# Patient Record
Sex: Female | Born: 1940
Health system: Southern US, Community
[De-identification: ages and names within clinical notes are randomized; demographics above are authoritative.]

## PROBLEM LIST (undated history)

## (undated) HISTORY — PX: TUBAL LIGATION: SHX77

---

## 2014-12-13 DIAGNOSIS — Z0000 Encounter for general adult medical examination without abnormal findings: Secondary | ICD-10-CM | POA: Diagnosis not present

## 2014-12-15 DIAGNOSIS — Z0000 Encounter for general adult medical examination without abnormal findings: Secondary | ICD-10-CM | POA: Diagnosis not present

## 2014-12-27 ENCOUNTER — Ambulatory Visit
Admit: 2014-12-27 | Disposition: A | Discharge status: Home / Self Care | Payer: Self-pay | Attending: Family Medicine | Admitting: Family Medicine

## 2014-12-27 DIAGNOSIS — Z1231 Encounter for screening mammogram for malignant neoplasm of breast: Secondary | ICD-10-CM | POA: Diagnosis not present

## 2017-03-23 ENCOUNTER — Other Ambulatory Visit: Payer: Self-pay | Admitting: Family Medicine

## 2017-03-23 DIAGNOSIS — Z1231 Encounter for screening mammogram for malignant neoplasm of breast: Secondary | ICD-10-CM

## 2017-03-31 ENCOUNTER — Ambulatory Visit
Admission: RE | Admit: 2017-03-31 | Discharge: 2017-03-31 | Disposition: A | Discharge status: Home / Self Care | Payer: Medicare HMO | Source: Ambulatory Visit | Attending: Family Medicine | Admitting: Family Medicine

## 2017-03-31 DIAGNOSIS — Z1231 Encounter for screening mammogram for malignant neoplasm of breast: Secondary | ICD-10-CM

## 2017-04-06 DIAGNOSIS — E7800 Pure hypercholesterolemia, unspecified: Secondary | ICD-10-CM | POA: Diagnosis not present

## 2017-04-06 DIAGNOSIS — M791 Myalgia: Secondary | ICD-10-CM | POA: Diagnosis not present

## 2017-04-27 ENCOUNTER — Encounter: Payer: Self-pay | Admitting: Radiology

## 2017-04-27 ENCOUNTER — Ambulatory Visit
Admission: RE | Admit: 2017-04-27 | Discharge: 2017-04-27 | Disposition: A | Discharge status: Home / Self Care | Payer: Medicare HMO | Source: Ambulatory Visit | Attending: Family Medicine | Admitting: Family Medicine

## 2017-04-27 DIAGNOSIS — Z1231 Encounter for screening mammogram for malignant neoplasm of breast: Secondary | ICD-10-CM | POA: Insufficient documentation

## 2018-05-20 DIAGNOSIS — Z0000 Encounter for general adult medical examination without abnormal findings: Secondary | ICD-10-CM | POA: Diagnosis not present

## 2018-05-20 DIAGNOSIS — E7800 Pure hypercholesterolemia, unspecified: Secondary | ICD-10-CM | POA: Diagnosis not present

## 2018-05-27 DIAGNOSIS — E7800 Pure hypercholesterolemia, unspecified: Secondary | ICD-10-CM | POA: Diagnosis not present

## 2018-05-27 DIAGNOSIS — Z0000 Encounter for general adult medical examination without abnormal findings: Secondary | ICD-10-CM | POA: Diagnosis not present

## 2018-06-07 ENCOUNTER — Other Ambulatory Visit: Payer: Self-pay | Admitting: Family Medicine

## 2018-06-07 DIAGNOSIS — Z1231 Encounter for screening mammogram for malignant neoplasm of breast: Secondary | ICD-10-CM

## 2018-06-22 ENCOUNTER — Ambulatory Visit
Admission: RE | Admit: 2018-06-22 | Discharge: 2018-06-22 | Disposition: A | Discharge status: Home / Self Care | Payer: Medicare HMO | Source: Ambulatory Visit | Attending: Family Medicine | Admitting: Family Medicine

## 2018-06-22 DIAGNOSIS — Z1231 Encounter for screening mammogram for malignant neoplasm of breast: Secondary | ICD-10-CM | POA: Diagnosis not present

## 2018-09-20 DIAGNOSIS — E7800 Pure hypercholesterolemia, unspecified: Secondary | ICD-10-CM | POA: Diagnosis not present

## 2018-09-27 DIAGNOSIS — Z23 Encounter for immunization: Secondary | ICD-10-CM | POA: Diagnosis not present

## 2018-09-27 DIAGNOSIS — E7800 Pure hypercholesterolemia, unspecified: Secondary | ICD-10-CM | POA: Diagnosis not present

## 2019-09-05 ENCOUNTER — Other Ambulatory Visit: Payer: Self-pay | Admitting: Family Medicine

## 2019-09-05 DIAGNOSIS — Z1231 Encounter for screening mammogram for malignant neoplasm of breast: Secondary | ICD-10-CM

## 2019-09-28 ENCOUNTER — Ambulatory Visit
Admission: RE | Admit: 2019-09-28 | Discharge: 2019-09-28 | Disposition: A | Discharge status: Home / Self Care | Payer: Medicare Other | Source: Ambulatory Visit | Attending: Family Medicine | Admitting: Family Medicine

## 2019-09-28 DIAGNOSIS — Z1231 Encounter for screening mammogram for malignant neoplasm of breast: Secondary | ICD-10-CM | POA: Insufficient documentation

## 2020-01-13 ENCOUNTER — Observation Stay
Admission: EM | Admit: 2020-01-13 | Discharge: 2020-01-14 | Disposition: A | Discharge status: Home / Self Care | Payer: Medicare Other | Attending: Internal Medicine | Admitting: Internal Medicine

## 2020-01-13 ENCOUNTER — Emergency Department: Payer: Medicare Other

## 2020-01-13 ENCOUNTER — Other Ambulatory Visit: Payer: Self-pay

## 2020-01-13 ENCOUNTER — Encounter: Payer: Self-pay | Admitting: Emergency Medicine

## 2020-01-13 DIAGNOSIS — E785 Hyperlipidemia, unspecified: Secondary | ICD-10-CM | POA: Insufficient documentation

## 2020-01-13 DIAGNOSIS — Z79899 Other long term (current) drug therapy: Secondary | ICD-10-CM | POA: Diagnosis not present

## 2020-01-13 DIAGNOSIS — R42 Dizziness and giddiness: Secondary | ICD-10-CM

## 2020-01-13 DIAGNOSIS — E876 Hypokalemia: Secondary | ICD-10-CM

## 2020-01-13 DIAGNOSIS — I10 Essential (primary) hypertension: Secondary | ICD-10-CM | POA: Diagnosis not present

## 2020-01-13 DIAGNOSIS — Z8673 Personal history of transient ischemic attack (TIA), and cerebral infarction without residual deficits: Secondary | ICD-10-CM | POA: Insufficient documentation

## 2020-01-13 DIAGNOSIS — I639 Cerebral infarction, unspecified: Secondary | ICD-10-CM | POA: Diagnosis not present

## 2020-01-13 DIAGNOSIS — Z20822 Contact with and (suspected) exposure to covid-19: Secondary | ICD-10-CM | POA: Diagnosis not present

## 2020-01-13 DIAGNOSIS — R112 Nausea with vomiting, unspecified: Secondary | ICD-10-CM | POA: Insufficient documentation

## 2020-01-13 LAB — BASIC METABOLIC PANEL
Anion gap: 10 (ref 5–15)
BUN: 13 mg/dL (ref 8–23)
CO2: 28 mmol/L (ref 22–32)
Calcium: 9 mg/dL (ref 8.9–10.3)
Chloride: 102 mmol/L (ref 98–111)
Creatinine, Ser: 0.82 mg/dL (ref 0.44–1.00)
GFR calc Af Amer: >60 mL/min (ref >60)
GFR calc non Af Amer: >60 mL/min (ref >60)
Glucose, Bld: 142 mg/dL — ABNORMAL HIGH (ref 70–99)
Potassium: 3.3 mmol/L — ABNORMAL LOW (ref 3.5–5.1)
Sodium: 140 mmol/L (ref 135–145)

## 2020-01-13 LAB — URINALYSIS, COMPLETE (UACMP) WITH MICROSCOPIC
Bilirubin Urine: NEGATIVE
Glucose, UA: 50 mg/dL — AB
Hgb urine dipstick: NEGATIVE
Ketones, ur: NEGATIVE mg/dL
Leukocytes,Ua: NEGATIVE
Nitrite: NEGATIVE
Protein, ur: >=300 mg/dL — AB
Specific Gravity, Urine: 1.018 (ref 1.005–1.030)
pH: 7 (ref 5.0–8.0)

## 2020-01-13 LAB — TROPONIN I (HIGH SENSITIVITY)
Troponin I (High Sensitivity): 10 ng/L (ref <18)
Troponin I (High Sensitivity): 11 ng/L (ref <18)
Troponin I (High Sensitivity): 9 ng/L (ref <18)

## 2020-01-13 LAB — CBC
HCT: 42.8 % (ref 36.0–46.0)
Hemoglobin: 13.7 g/dL (ref 12.0–15.0)
MCH: 24.7 pg — ABNORMAL LOW (ref 26.0–34.0)
MCHC: 32 g/dL (ref 30.0–36.0)
MCV: 77.3 fL — ABNORMAL LOW (ref 80.0–100.0)
Platelets: 284 10*3/uL (ref 150–400)
RBC: 5.54 MIL/uL — ABNORMAL HIGH (ref 3.87–5.11)
RDW: 14.3 % (ref 11.5–15.5)
WBC: 11.4 10*3/uL — ABNORMAL HIGH (ref 4.0–10.5)
nRBC: 0 % (ref 0.0–0.2)

## 2020-01-13 MED ORDER — SODIUM CHLORIDE 0.9 % IV SOLN: INTRAVENOUS | Status: DC

## 2020-01-13 MED ORDER — ONDANSETRON HCL 4 MG/2ML IJ SOLN: 4.0000 mg | Freq: Four times a day (QID) | INTRAMUSCULAR | Status: DC | PRN

## 2020-01-13 MED ORDER — MAGNESIUM HYDROXIDE 400 MG/5ML PO SUSP
30.0000 mL | Freq: Every day | ORAL | Status: DC | PRN
  Filled 2020-01-13: qty 30

## 2020-01-13 MED ORDER — ENOXAPARIN SODIUM 40 MG/0.4ML ~~LOC~~ SOLN
40.0000 mg | SUBCUTANEOUS | Status: DC
  Administered 2020-01-13: 40 mg via SUBCUTANEOUS
  Filled 2020-01-13: qty 0.4

## 2020-01-13 MED ORDER — ONDANSETRON HCL 4 MG/2ML IJ SOLN
4.0000 mg | Freq: Once | INTRAMUSCULAR | Status: AC
  Administered 2020-01-13: 4 mg via INTRAVENOUS
  Filled 2020-01-13: qty 2

## 2020-01-13 MED ORDER — ATORVASTATIN CALCIUM 20 MG PO TABS
40.0000 mg | ORAL_TABLET | Freq: Every day | ORAL | Status: DC
  Administered 2020-01-13: 40 mg via ORAL
  Filled 2020-01-13: qty 2

## 2020-01-13 MED ORDER — TRAZODONE HCL 50 MG PO TABS: 25.0000 mg | ORAL_TABLET | Freq: Every evening | ORAL | Status: DC | PRN

## 2020-01-13 MED ORDER — ACETAMINOPHEN 325 MG PO TABS: 650.0000 mg | ORAL_TABLET | Freq: Four times a day (QID) | ORAL | Status: DC | PRN

## 2020-01-13 MED ORDER — ASPIRIN EC 81 MG PO TBEC
81.0000 mg | DELAYED_RELEASE_TABLET | Freq: Every day | ORAL | Status: DC
  Administered 2020-01-13 – 2020-01-14 (×2): 81 mg via ORAL
  Filled 2020-01-13 (×2): qty 1

## 2020-01-13 MED ORDER — ACETAMINOPHEN 650 MG RE SUPP: 650.0000 mg | Freq: Four times a day (QID) | RECTAL | Status: DC | PRN

## 2020-01-13 MED ORDER — LABETALOL HCL 5 MG/ML IV SOLN: 20.0000 mg | INTRAVENOUS | Status: DC | PRN

## 2020-01-13 MED ORDER — STROKE: EARLY STAGES OF RECOVERY BOOK: Freq: Once | Status: AC

## 2020-01-13 MED ORDER — ONDANSETRON HCL 4 MG PO TABS: 4.0000 mg | ORAL_TABLET | Freq: Four times a day (QID) | ORAL | Status: DC | PRN

## 2020-01-13 MED ORDER — SODIUM CHLORIDE 0.9% FLUSH: 3.0000 mL | Freq: Once | INTRAVENOUS | Status: DC

## 2020-01-13 NOTE — ED Notes (Signed)
Offered pt food tray and drink but pt declined. Denies any needs.

## 2020-01-13 NOTE — ED Notes (Signed)
Pt's daughter given phone to talk with MRI staff as she speaks english. Asked if pt would like translator but family declined.

## 2020-01-13 NOTE — ED Notes (Signed)
Pt currently denies nausea 

## 2020-01-13 NOTE — H&P (Signed)
[facility]   PATIENT NAME: Kelsey Pineda    MR#:  [id number]  DATE OF BIRTH:  20-Allyiah Gartner-1942  DATE OF ADMISSION:  01/13/2020  PRIMARY CARE PHYSICIAN: Marisue Ivan, MD   REQUESTING/REFERRING PHYSICIAN: Artis Delay, MD  CHIEF COMPLAINT:   Chief Complaint  Patient presents with  . Dizziness  . Emesis    HISTORY OF PRESENT ILLNESS:  Kelsey Pineda  is a 79 y.o. female with a known history of hypertension and dyslipidemia, presented to emergency room with acute onset of dizziness and vomiting that started around 9 AM today with lightheadedness.  She was unable to walk but her daughter who provides translation as the patient speaks only Chad.  Stated that she did not have tinnitus or vertigo.  No chest pain or palpitations.  No nausea or vomiting or abdominal pain.  No cough or wheezing.  No witnessed seizures.  No loss of bowel or bladder control.  when she came to the ER, blood pressure was 181/73 with otherwise normal vital signs.  Labs revealed mild hypokalemia with potassium 3.3 and mild cytosis, UA with more than 300 protein and only 6-10 WBCs.  Noncontrasted head CT scan showed mild diffuse cerebral and cerebellar atrophy and chronic minimal small vessel white matter ischemic changes in both cerebral hemispheres with no acute abnormalities.  An MRI of the brain without contrast revealed acute nonhemorrhagic infarct involving the medial and posterior left cerebellar hemisphere with atrophy and white matter disease that is moderately advanced for age and represents the sequela of chronic microvascular ischemia.  She had normal flow voids in the posterior circulation.  The patient was ordered for milligrams IV Zofran.  She will be admitted to a medically monitored observation bed for further evaluation and management.  PAST MEDICAL HISTORY:  History reviewed. No pertinent past medical history. -Hypertension -Dyslipidemia  PAST SURGICAL HISTORY:   Past  Surgical History:  Procedure Laterality Date  . TUBAL LIGATION      SOCIAL HISTORY:   Social History   Tobacco Use  . Smoking status: Never Smoker  . Smokeless tobacco: Never Used  Substance Use Topics  . Alcohol use: Not Currently    FAMILY HISTORY:   Family History  Problem Relation Age of Onset  . Breast cancer Daughter 66    DRUG ALLERGIES:  No Known Allergies  REVIEW OF SYSTEMS:   ROS As per history of present illness. All pertinent systems were reviewed above. Constitutional,  HEENT, cardiovascular, respiratory, GI, GU, musculoskeletal, neuro, psychiatric, endocrine,  integumentary and hematologic systems were reviewed and are otherwise  negative/unremarkable except for positive findings mentioned above in the HPI.   MEDICATIONS AT HOME:   Prior to Admission medications   Not on File      VITAL SIGNS:  Blood pressure (!) 173/99, pulse 78, temperature 97.9 F (36.6 C), temperature source Oral, resp. rate 19, weight 54.4 kg, SpO2 96 %.  PHYSICAL EXAMINATION:  Physical Exam  GENERAL:  79 y.o.-year-old patient lying in the bed with no acute distress.  EYES: Pupils equal, round, reactive to light and accommodation. No scleral icterus. Extraocular muscles intact.  HEENT: Head atraumatic, normocephalic. Oropharynx and nasopharynx clear.  NECK:  Supple, no jugular venous distention. No thyroid enlargement, no tenderness.  LUNGS: Normal breath sounds bilaterally, no wheezing, rales,rhonchi or crepitation. No use of accessory muscles of respiration.  CARDIOVASCULAR: Regular rate and rhythm, S1, S2 normal. No murmurs, rubs, or gallops.  ABDOMEN: Soft, nondistended, nontender. Bowel sounds present.  No organomegaly or mass.  EXTREMITIES: No pedal edema, cyanosis, or clubbing.  NEUROLOGIC: Cranial nerves II through XII are intact. Muscle strength 5/5 in all extremities. Sensation intact. Gait not checked.  PSYCHIATRIC: The patient is alert and oriented x 3.  Normal  affect and good eye contact. SKIN: No obvious rash, lesion, or ulcer.   LABORATORY PANEL:   CBC Recent Labs  Lab 01/13/20 1238  WBC 11.4*  HGB 13.7  HCT 42.8  PLT 284   ------------------------------------------------------------------------------------------------------------------  Chemistries  Recent Labs  Lab 01/13/20 1238  NA 140  K 3.3*  CL 102  CO2 28  GLUCOSE 142*  BUN 13  CREATININE 0.82  CALCIUM 9.0   ------------------------------------------------------------------------------------------------------------------  Cardiac Enzymes No results for input(s): TROPONINI in the last 168 hours. ------------------------------------------------------------------------------------------------------------------  RADIOLOGY:  CT Head Wo Contrast  Result Date: 01/13/2020 CLINICAL DATA:  Ataxia, dizziness and vomiting beginning 30 minutes prior to admission. EXAM: CT HEAD WITHOUT CONTRAST TECHNIQUE: Contiguous axial images were obtained from the base of the skull through the vertex without intravenous contrast. COMPARISON:  None. FINDINGS: Brain: Mildly enlarged ventricles and cortical sulci. Minimal patchy white matter low density in both cerebral hemispheres. No intracranial hemorrhage, mass lesion or CT evidence of acute infarction. Vascular: No hyperdense vessel or unexpected calcification. Skull: Normal. Negative for fracture or focal lesion. Sinuses/Orbits: Unremarkable. Other: None. IMPRESSION: 1. No acute abnormality. 2. Mild diffuse cerebral and cerebellar atrophy. 3. Minimal chronic small vessel white matter ischemic changes in both cerebral hemispheres. Electronically Signed   By: Beckie Salts M.D.   On: 01/13/2020 15:51   MR BRAIN WO CONTRAST  Result Date: 01/13/2020 CLINICAL DATA:  Acute onset of dizziness and emesis. EXAM: MRI HEAD WITHOUT CONTRAST TECHNIQUE: Multiplanar, multiecho pulse sequences of the brain and surrounding structures were obtained without  intravenous contrast. COMPARISON:  CT head without contrast 01/13/2020 FINDINGS: Brain: The diffusion-weighted images demonstrate acute nonhemorrhagic infarct of the medial and posterior left cerebellar hemisphere. T2 signal changes are associated. No other acute infarct is present. Periventricular and diffuse subcortical white matter changes are moderately advanced. Mild generalized atrophy is present. The ventricles are of normal size. No significant extraaxial fluid collection is present. Vascular: Flow is present in the major intracranial arteries. Skull and upper cervical spine: The craniocervical junction is normal. Upper cervical spine is within normal limits. Marrow signal is unremarkable. Sinuses/Orbits: The paranasal sinuses and mastoid air cells are clear. The globes and orbits are within normal limits. IMPRESSION: 1. Acute nonhemorrhagic infarct involving the medial and posterior left cerebellar hemisphere. 2. Normal flow voids in the posterior circulation. 3. Atrophy and white matter disease is moderately advanced for age. This likely reflects the sequela of chronic microvascular ischemia. These results were called by telephone at the time of interpretation on 01/13/2020 at 6:33 pm to provider DAVID YAO , who verbally acknowledged these results. Electronically Signed   By: Marin Roberts M.D.   On: 01/13/2020 18:34      IMPRESSION AND PLAN:   1.  Acute left cerebellar infarct. -The patient will be admitted to a medically monitored bed. -We will place her on aspirin and statin therapy. -We will check her fasting lipids. -Permissive blood pressure control will be allowed. -PT/OT, ST and neurology consults will be obtained. -We will obtain bilateral carotid Doppler and 2D echo with bubble study for further assessment.  2.  Hypokalemia. -Potassium will be replaced and magnesium level will be checked.  3.  Hypertension. -The patient will be placed on  as needed IV labetalol for  now. -Permissive hypertension will be allowed.  4.  Dyslipidemia. -The patient will be placed on statin therapy I will check fasting lipids.  5.  DVT prophylaxis. -Subtenons Lovenox.   All the records are reviewed and case discussed with ED provider. The plan of care was discussed in details with the patient (and family). I answered all questions. The patient agreed to proceed with the above mentioned plan. Further management will depend upon hospital course.   CODE STATUS: Full code  Status is: Observation  The patient remains OBS appropriate and will d/c before 2 midnights.  Dispo: The patient is from: Home              Anticipated d/c is to: Home              Anticipated d/c date is: 1 day              Patient currently is not medically stable to d/c.   TOTAL TIME TAKING CARE OF THIS PATIENT: 55 minutes.    Christel Mormon M.D on 01/13/2020 at 7:24 PM  [facility]   From 7 PM-7 AM, contact night-coverage [email or web address]  CC: Primary care physician; Dion Body, MD   Note: This dictation was prepared with Dragon dictation along with smaller phrase technology. Any transcriptional errors that result from this process are unintentional.

## 2020-01-13 NOTE — ED Notes (Signed)
Admitting/consulting provider at bedside.

## 2020-01-13 NOTE — ED Notes (Signed)
Pt leaving for imaging.

## 2020-01-13 NOTE — ED Provider Notes (Signed)
[facility] Emergency Department Provider Note  ____________________________________________   First MD Initiated Contact with Patient 01/13/20 1534     (approximate)  I have reviewed the triage vital signs and the nursing notes.   HISTORY  Chief Complaint Dizziness and Emesis    HPI Kelsey Pineda is a 79 y.o. female with hypertension, hyperlipidemia who comes in for sudden onset dizziness.  Around 1030 patient started feeling dizzy like she was going to pass out as well as had nonbloody nonbilious vomiting.  Her symptoms have been moderate, constant, occur at rest but worse with moving around.  Denies ever having anything like this before.  Denies any abdominal pain, fevers.  Does not sound like any chest pain or shortness of breath or abdominal pain.   Family declined interpreter and daughter was at bedside who preferred to be the interpreter.          History reviewed. No pertinent past medical history.  There are no problems to display for this patient.   Past Surgical History:  Procedure Laterality Date  . TUBAL LIGATION      Prior to Admission medications   Not on File    Allergies Patient has no known allergies.  Family History  Problem Relation Age of Onset  . Breast cancer Daughter 45    Social History Social History   Tobacco Use  . Smoking status: Never Smoker  . Smokeless tobacco: Never Used  Substance Use Topics  . Alcohol use: Not Currently  . Drug use: Not Currently      Review of Systems Constitutional: No fever/chills Eyes: No visual changes. ENT: No sore throat. Cardiovascular: Denies chest pain. Respiratory: Denies shortness of breath. Gastrointestinal: No abdominal pain.  Positive nausea and vomiting no diarrhea.  No constipation. Genitourinary: Negative for dysuria. Musculoskeletal: Negative for back pain. Skin: Negative for rash. Neurological: Negative for headaches, focal weakness or numbness.   Positive lightheadedness and dizziness All other ROS negative ____________________________________________   PHYSICAL EXAM:  VITAL SIGNS: ED Triage Vitals  Enc Vitals Group     BP 01/13/20 1230 (!) 202/78     Pulse Rate 01/13/20 1230 90     Resp 01/13/20 1230 16     Temp 01/13/20 1230 97.8 F (36.6 C)     Temp Source 01/13/20 1230 Oral     SpO2 01/13/20 1230 99 %     Weight 01/13/20 1232 120 lb (54.4 kg)     Height --      Head Circumference --      Peak Flow --      Pain Score 01/13/20 1232 0     Pain Loc --      Pain Edu? --      Excl. in [place]? --     Constitutional: Alert and oriented. Well appearing and in no acute distress. Eyes: Conjunctivae are normal. EOMI. no nystagmus Head: Atraumatic. Nose: No congestion/rhinnorhea. Mouth/Throat: Mucous membranes are moist.   Neck: No stridor. Trachea Midline. FROM Cardiovascular: Normal rate, regular rhythm. Grossly normal heart sounds.  Good peripheral circulation. Respiratory: Normal respiratory effort.  No retractions. Lungs CTAB. Gastrointestinal: Soft and nontender. No distention. No abdominal bruits.  Musculoskeletal: No lower extremity tenderness nor edema.  No joint effusions. Neurologic: Nerves II through XII are intact.  Positive Romberg sign, finger-to-nose seems to be pretty intact maybe a little bit less coordinated on the left hand Skin:  Skin is warm, dry and intact. No rash noted. Psychiatric: Mood and affect  are normal. Speech and behavior are normal. GU: Deferred   ____________________________________________   LABS (all labs ordered are listed, but only abnormal results are displayed)  Labs Reviewed  BASIC METABOLIC PANEL - Abnormal; Notable for the following components:      Result Value   Potassium 3.3 (*)    Glucose, Bld 142 (*)    All other components within normal limits  CBC - Abnormal; Notable for the following components:   WBC 11.4 (*)    RBC 5.54 (*)    MCV 77.3 (*)    MCH 24.7 (*)     All other components within normal limits  URINALYSIS, COMPLETE (UACMP) WITH MICROSCOPIC - Abnormal; Notable for the following components:   Color, Urine YELLOW (*)    APPearance CLOUDY (*)    Glucose, UA 50 (*)    Protein, ur >=300 (*)    Bacteria, UA MANY (*)    All other components within normal limits  URINE CULTURE  CBG MONITORING, ED  TROPONIN I (HIGH SENSITIVITY)   ____________________________________________   ED ECG REPORT I, Concha Se, the attending physician, personally viewed and interpreted this ECG.  EKG is normal sinus rate of 89, no ST elevation, T wave inversions in 2 3 aVF V5 and V6, no priors to compare to, normal intervals ____________________________________________  RADIOLOGY I, Concha Se, personally viewed and evaluated these images (plain radiographs) as part of my medical decision making, as well as reviewing the written report by the radiologist.  ED MD interpretation: No intracranial hemorrhage  Official radiology report(s): CT Head Wo Contrast  Result Date: 01/13/2020 CLINICAL DATA:  Ataxia, dizziness and vomiting beginning 30 minutes prior to admission. EXAM: CT HEAD WITHOUT CONTRAST TECHNIQUE: Contiguous axial images were obtained from the base of the skull through the vertex without intravenous contrast. COMPARISON:  None. FINDINGS: Brain: Mildly enlarged ventricles and cortical sulci. Minimal patchy white matter low density in both cerebral hemispheres. No intracranial hemorrhage, mass lesion or CT evidence of acute infarction. Vascular: No hyperdense vessel or unexpected calcification. Skull: Normal. Negative for fracture or focal lesion. Sinuses/Orbits: Unremarkable. Other: None. IMPRESSION: 1. No acute abnormality. 2. Mild diffuse cerebral and cerebellar atrophy. 3. Minimal chronic small vessel white matter ischemic changes in both cerebral hemispheres. Electronically Signed   By: Beckie Salts M.D.   On: 01/13/2020 15:51     ____________________________________________   PROCEDURES  Procedure(s) performed (including Critical Care):  Procedures   ____________________________________________   INITIAL IMPRESSION / ASSESSMENT AND PLAN / ED COURSE  Kelsey Pineda was evaluated in Emergency Department on 01/13/2020 for the symptoms described in the history of present illness. She was evaluated in the context of the global COVID-19 pandemic, which necessitated consideration that the patient might be at risk for infection with the SARS-CoV-2 virus that causes COVID-19. Institutional protocols and algorithms that pertain to the evaluation of patients at risk for COVID-19 are in a state of rapid change based on information released by regulatory bodies including the CDC and federal and state organizations. These policies and algorithms were followed during the patient's care in the ED.    Patient is a 79 year old who comes in hypertensive with sudden onset vomiting and headaches.  Will get labs to evaluate for Electra abnormalities, AKI, anemia, UTI.  Will get CT head to evaluate for intracranial hemorrhage.  If work-up is otherwise reassuring we will get an MRI to make sure no evidence of cerebellar stroke or press syndrome.  Patient EKG had some flipped  T waves so we will add on troponin.  Troponin was reassuring.  Labs reassuring.  UA with some protein and concern for WBCs but denies any urinary symptoms.  Will send culture.  MRI consistent with stroke in the cerebellum.  Will discuss with hospital team for admission       ____________________________________________   FINAL CLINICAL IMPRESSION(S) / ED DIAGNOSES   Final diagnoses:  History of stroke involving cerebellum  Dizziness  Nausea and vomiting, intractability of vomiting not specified, unspecified vomiting type      MEDICATIONS GIVEN DURING THIS VISIT:  Medications  sodium chloride flush (NS) 0.9 % injection 3 mL (has no  administration in time range)  ondansetron (ZOFRAN) injection 4 mg (4 mg Intravenous Given 01/13/20 1847)     ED Discharge Orders    None       Note:  This document was prepared using Dragon voice recognition software and may include unintentional dictation errors.   Concha Se, MD 01/13/20 [phone]

## 2020-01-13 NOTE — ED Triage Notes (Signed)
Pt to ED via POV c/o dizziness and emesis that started about 30 minutes PTA. Pt denies sensation of room spinning. Pt is in NAD.

## 2020-01-13 NOTE — ED Notes (Signed)
EDP [place] at bedside. Pt's visitor at bedside.

## 2020-01-13 NOTE — ED Notes (Signed)
Pt given cup for urine sample, unable to go at this time

## 2020-01-13 NOTE — ED Notes (Signed)
Pt back from imaging

## 2020-01-14 ENCOUNTER — Observation Stay: Payer: Medicare Other

## 2020-01-14 DIAGNOSIS — Z8673 Personal history of transient ischemic attack (TIA), and cerebral infarction without residual deficits: Secondary | ICD-10-CM

## 2020-01-14 DIAGNOSIS — R112 Nausea with vomiting, unspecified: Secondary | ICD-10-CM

## 2020-01-14 DIAGNOSIS — I639 Cerebral infarction, unspecified: Secondary | ICD-10-CM | POA: Diagnosis not present

## 2020-01-14 DIAGNOSIS — R42 Dizziness and giddiness: Secondary | ICD-10-CM

## 2020-01-14 LAB — LIPID PANEL
Cholesterol: 146 mg/dL (ref 0–200)
HDL: 54 mg/dL (ref >40)
LDL Cholesterol: 71 mg/dL (ref 0–99)
Total CHOL/HDL Ratio: 2.7 RATIO
Triglycerides: 105 mg/dL (ref <150)
VLDL: 21 mg/dL (ref 0–40)

## 2020-01-14 LAB — HEMOGLOBIN A1C
Hgb A1c MFr Bld: 5.7 % — ABNORMAL HIGH (ref 4.8–5.6)
Mean Plasma Glucose: 116.89 mg/dL

## 2020-01-14 LAB — CBC
HCT: 38.6 % (ref 36.0–46.0)
Hemoglobin: 12.8 g/dL (ref 12.0–15.0)
MCH: 25.5 pg — ABNORMAL LOW (ref 26.0–34.0)
MCHC: 33.2 g/dL (ref 30.0–36.0)
MCV: 76.9 fL — ABNORMAL LOW (ref 80.0–100.0)
Platelets: 277 10*3/uL (ref 150–400)
RBC: 5.02 MIL/uL (ref 3.87–5.11)
RDW: 14.4 % (ref 11.5–15.5)
WBC: 8.3 10*3/uL (ref 4.0–10.5)
nRBC: 0 % (ref 0.0–0.2)

## 2020-01-14 LAB — BASIC METABOLIC PANEL
Anion gap: 10 (ref 5–15)
BUN: 17 mg/dL (ref 8–23)
CO2: 26 mmol/L (ref 22–32)
Calcium: 8.6 mg/dL — ABNORMAL LOW (ref 8.9–10.3)
Chloride: 106 mmol/L (ref 98–111)
Creatinine, Ser: 1.03 mg/dL — ABNORMAL HIGH (ref 0.44–1.00)
GFR calc Af Amer: >60 mL/min (ref >60)
GFR calc non Af Amer: 52 mL/min — ABNORMAL LOW (ref >60)
Glucose, Bld: 109 mg/dL — ABNORMAL HIGH (ref 70–99)
Potassium: 3.1 mmol/L — ABNORMAL LOW (ref 3.5–5.1)
Sodium: 142 mmol/L (ref 135–145)

## 2020-01-14 LAB — URINE CULTURE: Culture: <10000 — AB

## 2020-01-14 LAB — SARS CORONAVIRUS 2 (TAT 6-24 HRS): SARS Coronavirus 2: NEGATIVE

## 2020-01-14 MED ORDER — POTASSIUM CHLORIDE CRYS ER 20 MEQ PO TBCR
40.0000 meq | EXTENDED_RELEASE_TABLET | Freq: Once | ORAL | Status: AC
  Administered 2020-01-14: 40 meq via ORAL
  Filled 2020-01-14: qty 2

## 2020-01-14 MED ORDER — ASPIRIN 81 MG PO TBEC: 81.0000 mg | DELAYED_RELEASE_TABLET | Freq: Every day | ORAL | 0 refills | Status: AC

## 2020-01-14 NOTE — Progress Notes (Signed)
Chart reviewed, Pt currenlty sleeping with no family in the room. Per Nsg notes, no dysphagia on swallow screen, regular diet ordered. No reports of speech or language difficulty in chart. Pt speaks Chad. No apparent ST needs noted noted the chart. Will f/u when family is present and Pt is awake to screen for any ST speech needs.

## 2020-01-14 NOTE — Progress Notes (Signed)
Son of pt, Orvilla Fus assisted in answering question for completion of admission profile.

## 2020-01-14 NOTE — Progress Notes (Signed)
Daughter is now in the room. She reports no speech, language or swallowing problems. Daughter stated Kelsey Pineda's speech is the same as it was prior to this event. No ST needs at this time. Please reconsult for swallow eval if any concerns are noted later.

## 2020-01-14 NOTE — Discharge Summary (Signed)
5         at [facility]   PATIENT NAME: Kelsey Pineda    MR#:  [id number]  DATE OF BIRTH:  1940-10-03  DATE OF ADMISSION:  01/13/2020   ADMITTING PHYSICIAN: Hannah Beat, MD  DATE OF DISCHARGE: 01/14/2020  1:42 PM  PRIMARY CARE PHYSICIAN: Marisue Ivan, MD   ADMISSION DIAGNOSIS:  Dizziness [R42] CVA (cerebral vascular accident) (HCC) [I63.9] Cerebellar stroke, acute (HCC) [I63.9] History of stroke involving cerebellum [Z86.73] Nausea and vomiting, intractability of vomiting not specified, unspecified vomiting type [R11.2] DISCHARGE DIAGNOSIS:  Active Problems:   Cerebrovascular accident (CVA) (HCC)   History of stroke involving cerebellum   Dizziness   Nausea and vomiting  SECONDARY DIAGNOSIS:  History reviewed. No pertinent past medical history. HOSPITAL COURSE:  79 y.o. female known history of hypertension and dyslipidemia admitted acute onset of dizziness and vomiting.   Acute stroke -Noncontrasted head CT scan showed mild diffuse cerebral and cerebellar atrophy and chronic minimal small vessel white matter ischemic changes in both cerebral hemispheres with no acute abnormalities.  - MRI of the brain without contrast revealed acute nonhemorrhagic infarct involving the medial and posterior left cerebellar hemisphere. No anti platelet therapy prior to admission.  Patient was started on ASA 81mg .  - As per daughter she is back to baseline - No reported dizziness -No skilled PT/OT identified - ASA 81mg  /statin daily  DISCHARGE CONDITIONS:  stable CONSULTS OBTAINED:  Treatment Team:  , MD DRUG ALLERGIES:  No Known Allergies DISCHARGE MEDICATIONS:   Allergies as of 01/14/2020   No Known Allergies     Medication List    TAKE these medications   aspirin 81 MG EC tablet Take 1 tablet (81 mg total) by mouth daily. Start taking on: January 15, 2020   atorvastatin 40 MG tablet Commonly known as: LIPITOR Take 40 mg by mouth  at bedtime.      DISCHARGE INSTRUCTIONS:   DIET:  Low fat, Low cholesterol diet DISCHARGE CONDITION:  Stable ACTIVITY:  Activity as tolerated OXYGEN:  Home Oxygen: No.  Oxygen Delivery: room air DISCHARGE LOCATION:  home   If you experience worsening of your admission symptoms, develop shortness of breath, life threatening emergency, suicidal or homicidal thoughts you must seek medical attention immediately by calling 911 or calling your MD immediately  if symptoms less severe.  You Must read complete instructions/literature along with all the possible adverse reactions/side effects for all the Medicines you take and that have been prescribed to you. Take any new Medicines after you have completely understood and accpet all the possible adverse reactions/side effects.   Please note  You were cared for by a hospitalist during your hospital stay. If you have any questions about your discharge medications or the care you received while you were in the hospital after you are discharged, you can call the unit and asked to speak with the hospitalist on call if the hospitalist that took care of you is not available. Once you are discharged, your primary care physician will handle any further medical issues. Please note that NO REFILLS for any discharge medications will be authorized once you are discharged, as it is imperative that you return to your primary care physician (or establish a relationship with a primary care physician if you do not have one) for your aftercare needs so that they can reassess your need for medications and monitor your lab values.    On the day of Discharge:  VITAL SIGNS:  Blood pressure (!) 156/72, pulse 76, temperature 98.2 F (36.8 C), temperature source Oral, resp. rate (!) 22, weight 54.4 kg, SpO2 93 %. PHYSICAL EXAMINATION:  GENERAL:  79 y.o.-year-old patient lying in the bed with no acute distress.  EYES: Pupils equal, round, reactive to light and  accommodation. No scleral icterus. Extraocular muscles intact.  HEENT: Head atraumatic, normocephalic. Oropharynx and nasopharynx clear.  NECK:  Supple, no jugular venous distention. No thyroid enlargement, no tenderness.  LUNGS: Normal breath sounds bilaterally, no wheezing, rales,rhonchi or crepitation. No use of accessory muscles of respiration.  CARDIOVASCULAR: S1, S2 normal. No murmurs, rubs, or gallops.  ABDOMEN: Soft, non-tender, non-distended. Bowel sounds present. No organomegaly or mass.  EXTREMITIES: No pedal edema, cyanosis, or clubbing.  NEUROLOGIC: Cranial nerves II through XII are intact. Muscle strength 5/5 in all extremities. Sensation intact. Gait not checked.  PSYCHIATRIC: The patient is alert and oriented x 3.  SKIN: No obvious rash, lesion, or ulcer.  DATA REVIEW:   CBC Recent Labs  Lab 01/14/20 0520  WBC 8.3  HGB 12.8  HCT 38.6  PLT 277    Chemistries  Recent Labs  Lab 01/14/20 0520  NA 142  K 3.1*  CL 106  CO2 26  GLUCOSE 109*  BUN 17  CREATININE 1.03*  CALCIUM 8.6*     Outpatient follow-up Follow-up Information    Dion Body, MD. Schedule an appointment as soon as possible for a visit in 1 week(s).   Specialty: Family Medicine Contact information: [address] [place] [address] [phone]        Vladimir Crofts, MD. Schedule an appointment as soon as possible for a visit in 2 week(s).   Specialty: Neurology Contact information: [address] [place] [place] [address] [phone]            Management plans discussed with the patient, family and they are in agreement.  CODE STATUS: Full Code   TOTAL TIME TAKING CARE OF THIS PATIENT: 45 minutes.    Max Sane M.D on 01/14/2020 at 4:38 PM  [facility]   CC: Primary care physician; Dion Body, MD   Note: This dictation was prepared with Dragon dictation along with smaller phrase  technology. Any transcriptional errors that result from this process are unintentional.

## 2020-01-14 NOTE — Progress Notes (Signed)
Pt discharged via private vehicle. Discharge instructions explained and given to pt's daughter. No further questions at this time. No s/s of distress noted. IV and tele box removed.

## 2020-01-14 NOTE — Progress Notes (Addendum)
Pt passed swallow screen

## 2020-01-14 NOTE — Discharge Instructions (Signed)
 Hospital Discharge After a Stroke  Being discharged from the hospital after a stroke can feel overwhelming. Many things may be different, and it is normal to feel scared or anxious. Some stroke survivors may be able to return to their homes, and others may need more specialized care on a temporary or permanent basis. Your stroke care team will work with you to develop a discharge plan that is best for you. Ask questions if you do not understand something. Invite a friend or family member to participate in discharge planning. Understanding and following your discharge plan can help to prevent another stroke or other problems. Understanding your medicines After a stroke, your health care provider may prescribe one or more types of medicine. It is important to take medicines exactly as told by your health care provider. Serious harm, such as another stroke, can happen if you are unable to take your medicine exactly as prescribed. Make sure you understand:  What medicine to take.  Why you are taking the medicine.  How and when to take it.  If it can be taken with your other medicines and herbal supplements.  Possible side effects.  When to call your health care provider if you have any side effects.  How you will get and pay for your medicines. Medical assistance programs may be able to help you pay for prescription medicines if you cannot afford them. If you are taking an anticoagulant, be sure to take it exactly as told by your health care provider. This type of medicine can increase the risk of bleeding because it works to prevent blood from clotting. You may need to take certain precautions to prevent bleeding. You should contact your health care provider if you have:  Bleeding or bruising.  A fall or other injury to your head.  Blood in your urine or stool (feces). Planning for home safety  Take steps to prevent falls, such as installing grab bars or using a shower chair. Ask a  friend or family member to get needed things in place before you go home if possible. A therapist can come to your home to make recommendations for safety equipment. Ask your health care provider if you would benefit from this service or from home care. Getting needed equipment Ask your health care provider for a list of any medical equipment and supplies you will need at home. These may include items such as:  Walkers.  Canes.  Wheelchairs.  Hand-strengthening devices.  Special eating utensils. Medical equipment can be rented or purchased, depending on your insurance coverage. Check with your insurance company about what is covered. Keeping follow-up visits After a stroke, you will need to follow up regularly with a health care provider. You may also need rehabilitation, which can include physical therapy, occupational therapy, or speech-language therapy. Keeping these appointments is very important to your recovery after a stroke. Be sure to bring your medicine list and discharge papers with you to your appointments. If you need help to keep track of your schedule, use a calendar or appointment reminder. Preventing another stroke Having a stroke puts you at risk for another stroke in the future. Ask your health care provider what actions you can take to lower the risk. These may include:  Increasing how much you exercise.  Making a healthy eating plan.  Quitting smoking.  Managing other health conditions, such as high blood pressure, high cholesterol, or diabetes.  Limiting alcohol use. Knowing the warning signs of a stroke  Make   sure you understand the signs of a stroke. Before you leave the hospital, you will receive information outlining the stroke warning signs. Share these with your friends and family members. "BE FAST" is an easy way to remember the main warning signs of a stroke:  B - Balance. Signs are dizziness, sudden trouble walking, or loss of balance.  E - Eyes.  Signs are trouble seeing or a sudden change in vision.  F - Face. Signs are sudden weakness or numbness of the face, or the face or eyelid drooping on one side.  A - Arms. Signs are weakness or numbness in an arm. This happens suddenly and usually on one side of the body.  S - Speech. Signs are sudden trouble speaking, slurred speech, or trouble understanding what people say.  T - Time. Time to call emergency services. Write down what time symptoms started. Other signs of stroke may include:  A sudden, severe headache with no known cause.  Nausea or vomiting.  Seizure. These symptoms may represent a serious problem that is an emergency. Do not wait to see if the symptoms will go away. Get medical help right away. Call your local emergency services (911 in the [place] Do not drive yourself to the hospital. Make note of the time that you had your first symptoms. Your emergency responders or emergency room staff will need to know this information. Summary  Being discharged from the hospital after a stroke can feel overwhelming. It is normal to feel scared or anxious.  Make sure you take medicines exactly as told by your health care provider.  Know the warning signs of a stroke, and get help right way if you have any of these symptoms. "BE FAST" is an easy way to remember the main warning signs of a stroke. This information is not intended to replace advice given to you by your health care provider. Make sure you discuss any questions you have with your health care provider. Document Revised: 06/08/2019 Document Reviewed: 12/19/2016 Elsevier Patient Education  2020 Elsevier Inc.  

## 2020-01-14 NOTE — Consult Note (Signed)
Reason for Consult: dizziness  Requesting Physician: Dr. Manuella Ghazi  CC: dizziness    HPI: Kelsey Pineda is an 79 y.o. female  known history of hypertension and dyslipidemia, presented to emergency room with acute onset of dizziness and vomiting that started around 9 AM yesterday ightheadedness.  She was unable to walk but her daughter who provides translation as the patient speaks only [place]. Bo nausea or vomiting or abdominal pain.  No cough or wheezing.  No witnessed seizures.  No loss of bowel or bladder control. Hypertensive on admission with BP of 181/73 Noncontrasted head CT scan showed mild diffuse cerebral and cerebellar atrophy and chronic minimal small vessel white matter ischemic changes in both cerebral hemispheres with no acute abnormalities.   On MRI of the brain without contrast revealed acute nonhemorrhagic infarct involving the medial and posterior left cerebellar hemisphere. As per daughter dizziness has resolved  History reviewed. No pertinent past medical history.  Past Surgical History:  Procedure Laterality Date  . TUBAL LIGATION      Family History  Problem Relation Age of Onset  . Breast cancer Daughter 27    Social History:  reports that she has never smoked. She has never used smokeless tobacco. She reports previous alcohol use. She reports previous drug use.  No Known Allergies  Medications: I have reviewed the patient's current medications.  ROS: Unable to obtain due to language barrier   Physical Examination: Blood pressure (!) 156/72, pulse 76, temperature 98.2 F (36.8 C), temperature source Oral, resp. rate (!) 22, weight 54.4 kg, SpO2 93 %.    Neurological Examination   Mental Status: Alert, oriented through translator Cranial Nerves: II: Discs flat bilaterally; Visual fields grossly normal, pupils equal, round, reactive to light and accommodation III,IV, VI: ptosis not present, extra-ocular motions intact bilaterally V,VII: smile symmetric,  facial light touch sensation normal bilaterally VIII: hearing normal bilaterally IX,X: gag reflex present XI: bilateral shoulder shrug XII: midline tongue extension Motor: Right : Upper extremity   5/5    Left:     Upper extremity   5/5  Lower extremity   5/5     Lower extremity   5/5 Tone and bulk:normal tone throughout; no atrophy noted Sensory: Pinprick and light touch intact throughout, bilaterally Deep Tendon Reflexes: 2+ and symmetric throughout Plantars: Right: downgoing   Left: downgoing Cerebellar: normal finger-to-nose, normal rapid alternating movements and normal heel-to-shin test Gait: not tested      Laboratory Studies:   Basic Metabolic Panel: Recent Labs  Lab 01/13/20 1238 01/14/20 0520  NA 140 142  K 3.3* 3.1*  CL 102 106  CO2 28 26  GLUCOSE 142* 109*  BUN 13 17  CREATININE 0.82 1.03*  CALCIUM 9.0 8.6*    Liver Function Tests: No results for input(s): AST, ALT, ALKPHOS, BILITOT, PROT, ALBUMIN in the last 168 hours. No results for input(s): LIPASE, AMYLASE in the last 168 hours. No results for input(s): AMMONIA in the last 168 hours.  CBC: Recent Labs  Lab 01/13/20 1238 01/14/20 0520  WBC 11.4* 8.3  HGB 13.7 12.8  HCT 42.8 38.6  MCV 77.3* 76.9*  PLT 284 277    Cardiac Enzymes: No results for input(s): CKTOTAL, CKMB, CKMBINDEX, TROPONINI in the last 168 hours.  BNP: Invalid input(s): POCBNP  CBG: No results for input(s): GLUCAP in the last 168 hours.  Microbiology: Results for orders placed or performed during the hospital encounter of 01/13/20  SARS CORONAVIRUS 2 (TAT 6-24 HRS) Nasopharyngeal Nasopharyngeal Swab  Status: None   Collection Time: 01/13/20  8:33 PM   Specimen: Nasopharyngeal Swab  Result Value Ref Range Status   SARS Coronavirus 2 NEGATIVE NEGATIVE Final    Comment: (NOTE) SARS-CoV-2 target nucleic acids are NOT DETECTED. The SARS-CoV-2 RNA is generally detectable in upper and lower respiratory specimens during  the acute phase of infection. Negative results do not preclude SARS-CoV-2 infection, do not rule out co-infections with other pathogens, and should not be used as the sole basis for treatment or other patient management decisions. Negative results must be combined with clinical observations, patient history, and epidemiological information. The expected result is Negative. Fact Sheet for Patients: HairSlick.no Fact Sheet for Healthcare Providers: quierodirigir.com This test is not yet approved or cleared by the [place] FDA and  has been authorized for detection and/or diagnosis of SARS-CoV-2 by FDA under an Emergency Use Authorization (EUA). This EUA will remain  in effect (meaning this test can be used) for the duration of the COVID-19 declaration under Section 56 4(b)(1) of the Act, 21 U.S.C. section 360bbb-3(b)(1), unless the authorization is terminated or revoked sooner. Performed at [facility], [address], [place], [place] [address]     Coagulation Studies: No results for input(s): LABPROT, INR in the last 72 hours.  Urinalysis:  Recent Labs  Lab 01/13/20 1238  COLORURINE YELLOW*  LABSPEC 1.018  PHURINE 7.0  GLUCOSEU 50*  HGBUR NEGATIVE  BILIRUBINUR NEGATIVE  KETONESUR NEGATIVE  PROTEINUR >=300*  NITRITE NEGATIVE  LEUKOCYTESUR NEGATIVE    Lipid Panel:     Component Value Date/Time   CHOL 146 01/14/2020 0520   TRIG 105 01/14/2020 0520   HDL 54 01/14/2020 0520   CHOLHDL 2.7 01/14/2020 0520   VLDL 21 01/14/2020 0520   LDLCALC 71 01/14/2020 0520    HgbA1C: No results found for: HGBA1C  Urine Drug Screen:  No results found for: LABOPIA, COCAINSCRNUR, LABBENZ, AMPHETMU, THCU, LABBARB  Alcohol Level: No results for input(s): ETH in the last 168 hours.  Other results: EKG: normal EKG, normal sinus rhythm, unchanged from previous tracings.  Imaging: CT Head Wo Contrast  Result Date:  01/13/2020 CLINICAL DATA:  Ataxia, dizziness and vomiting beginning 30 minutes prior to admission. EXAM: CT HEAD WITHOUT CONTRAST TECHNIQUE: Contiguous axial images were obtained from the base of the skull through the vertex without intravenous contrast. COMPARISON:  None. FINDINGS: Brain: Mildly enlarged ventricles and cortical sulci. Minimal patchy white matter low density in both cerebral hemispheres. No intracranial hemorrhage, mass lesion or CT evidence of acute infarction. Vascular: No hyperdense vessel or unexpected calcification. Skull: Normal. Negative for fracture or focal lesion. Sinuses/Orbits: Unremarkable. Other: None. IMPRESSION: 1. No acute abnormality. 2. Mild diffuse cerebral and cerebellar atrophy. 3. Minimal chronic small vessel white matter ischemic changes in both cerebral hemispheres. Electronically Signed   By: Beckie Salts M.D.   On: 01/13/2020 15:51   MR BRAIN WO CONTRAST  Result Date: 01/13/2020 CLINICAL DATA:  Acute onset of dizziness and emesis. EXAM: MRI HEAD WITHOUT CONTRAST TECHNIQUE: Multiplanar, multiecho pulse sequences of the brain and surrounding structures were obtained without intravenous contrast. COMPARISON:  CT head without contrast 01/13/2020 FINDINGS: Brain: The diffusion-weighted images demonstrate acute nonhemorrhagic infarct of the medial and posterior left cerebellar hemisphere. T2 signal changes are associated. No other acute infarct is present. Periventricular and diffuse subcortical white matter changes are moderately advanced. Mild generalized atrophy is present. The ventricles are of normal size. No significant extraaxial fluid collection is present. Vascular: Flow  is present in the major intracranial arteries. Skull and upper cervical spine: The craniocervical junction is normal. Upper cervical spine is within normal limits. Marrow signal is unremarkable. Sinuses/Orbits: The paranasal sinuses and mastoid air cells are clear. The globes and orbits are within  normal limits. IMPRESSION: 1. Acute nonhemorrhagic infarct involving the medial and posterior left cerebellar hemisphere. 2. Normal flow voids in the posterior circulation. 3. Atrophy and white matter disease is moderately advanced for age. This likely reflects the sequela of chronic microvascular ischemia. These results were called by telephone at the time of interpretation on 01/13/2020 at 6:33 pm to provider DAVID YAO , who verbally acknowledged these results. Electronically Signed   By: Marin Roberts M.D.   On: 01/13/2020 18:34     Assessment/Plan:  79 y.o. female  known history of hypertension and dyslipidemia, presented to emergency room with acute onset of dizziness and vomiting that started around 9 AM yesterday ightheadedness.  She was unable to walk but her daughter who provides translation as the patient speaks only [place]. Bo nausea or vomiting or abdominal pain.  No cough or wheezing.  No witnessed seizures.  No loss of bowel or bladder control. Hypertensive on admission with BP of 181/73 Noncontrasted head CT scan showed mild diffuse cerebral and cerebellar atrophy and chronic minimal small vessel white matter ischemic changes in both cerebral hemispheres with no acute abnormalities.   On MRI of the brain without contrast revealed acute nonhemorrhagic infarct involving the medial and posterior left cerebellar hemisphere. No anti platelet therapy prior to admission.  Patient was started on ASA 81mg .   - As per daughter she is back to baseline - No reported dizziness - pt/ot - ASA 81mg  /statin daily and d/c planning  01/14/2020, 10:16 AM

## 2020-01-14 NOTE — Evaluation (Signed)
Physical Therapy Evaluation Patient Details Name: Kelsey Pineda MRN: [id number] DOB: 04-28-1941 Today's Date: 01/14/2020   History of Present Illness  Pt admitted for cerebellar CVA with complaints of dizziness/emesis. History includes HTN. Pt speaks [place], phone interpretation done with Marcello Moores.  Clinical Impression  Pt is a pleasant 79 year old female who was admitted for CVA. This therapist used the language line on speaker phone, however primarily used son for interpretation. Pt demonstrates all bed mobility/transfers/ambulation at baseline level. Pt with slight unsteadiness initially, however no further LOB noted. Pt able to turn head in all directions with ambulation and change gait speed. Education given for falls prevention. Pt has good support in home environment. Strength/coordination/sensation intact. Pt does not require any further PT needs at this time. Pt will be dc in house and does not require follow up. RN aware. Will dc current orders.     Follow Up Recommendations No PT follow up    Equipment Recommendations  None recommended by PT    Recommendations for Other Services       Precautions / Restrictions Precautions Precautions: Fall Restrictions Weight Bearing Restrictions: No      Mobility  Bed Mobility Overal bed mobility: Independent             General bed mobility comments: safe technique with upright posture  Transfers Overall transfer level: Independent Equipment used: None             General transfer comment: safe technique with upright posture  Ambulation/Gait Ambulation/Gait assistance: Min guard Gait Distance (Feet): 150 Feet Assistive device: None Gait Pattern/deviations: Step-through pattern     General Gait Details: ambulated in hallway with 1 LOB noted veering to the L side with cga for correction.  No other LOB noted. No fatigue present. Good gait speed  Stairs            Wheelchair Mobility    Modified Rankin  (Stroke Patients Only)       Balance Overall balance assessment: Mild deficits observed, not formally tested                                           Pertinent Vitals/Pain Pain Assessment: No/denies pain    Home Living Family/patient expects to be discharged to:: Private residence Living Arrangements: Children(son ) Available Help at Discharge: Available PRN/intermittently(son available on the weekends, other family as needed) Type of Home: House Home Access: Stairs to enter Entrance Stairs-Rails: None Entrance Stairs-Number of Steps: 1 Home Layout: One level Home Equipment: None      Prior Function Level of Independence: Independent         Comments: reports no falls and is very active walking in home and likes to garden     Hand Dominance        Extremity/Trunk Assessment   Upper Extremity Assessment Upper Extremity Assessment: Overall WFL for tasks assessed    Lower Extremity Assessment Lower Extremity Assessment: Overall WFL for tasks assessed       Communication   Communication: Prefers language other than [place];Interpreter utilized  Cognition Arousal/Alertness: Awake/alert Behavior During Therapy: WFL for tasks assessed/performed Overall Cognitive Status: Within Functional Limits for tasks assessed  General Comments      Exercises     Assessment/Plan    PT Assessment Patent does not need any further PT services  PT Problem List         PT Treatment Interventions      PT Goals (Current goals can be found in the Care Plan section)  Acute Rehab PT Goals Patient Stated Goal: to go home PT Goal Formulation: All assessment and education complete, DC therapy Time For Goal Achievement: 01/14/20 Potential to Achieve Goals: Good    Frequency     Barriers to discharge        Co-evaluation               AM-PAC PT "6 Clicks" Mobility  Outcome Measure Help  needed turning from your back to your side while in a flat bed without using bedrails?: None Help needed moving from lying on your back to sitting on the side of a flat bed without using bedrails?: None Help needed moving to and from a bed to a chair (including a wheelchair)?: None Help needed standing up from a chair using your arms (e.g., wheelchair or bedside chair)?: None Help needed to walk in hospital room?: None Help needed climbing 3-5 steps with a railing? : None 6 Click Score: 24    End of Session Equipment Utilized During Treatment: Gait belt Activity Tolerance: Patient tolerated treatment well Patient left: in chair;with chair alarm set Nurse Communication: Mobility status PT Visit Diagnosis: Muscle weakness (generalized) (M62.81)    Time: 0947-0962 PT Time Calculation (min) (ACUTE ONLY): 23 min   Charges:   PT Evaluation $PT Eval Low Complexity: 1 Low PT Treatments $Gait Training: 8-22 mins        Elizabeth Palau, PT, DPT [id number]   Veronda Gabor 01/14/2020, 11:58 AM

## 2020-01-14 NOTE — Evaluation (Signed)
Occupational Therapy Evaluation Patient Details Name: Kelsey Pineda MRN: [id number] DOB: 1941/09/15 Today's Date: 01/14/2020    History of Present Illness Pt admitted for cerebellar CVA with complaints of dizziness/emesis. History includes HTN. Pt speaks [place], phone interpretation done with Marcello Moores.   Clinical Impression   Interpreter services were initiated with [facility] through the language line. Pt.'s daughter was present and assisted in communicating with her mother. Pt. resides with her son.  Pt. was independent with ADLs, and IADL functioning. Pt. was independent with toileting transfers, and toilet hygiene skills, and self grooming skills while standing at the sinkside. Pt. Education was provided to the pt. And her daughter about OT services, pt.'s UE, and functional status. Pt. is back near baseline with UE, and ADL functioning. No further OT services are warranted at this time. Pt. Plans to return home upon discharge, with family assistance as needed. No follow-up OT services are warranted at this time. Will discontinue the order.     Follow Up Recommendations  No OT follow up    Equipment Recommendations       Recommendations for Other Services       Precautions / Restrictions Precautions Precautions: Fall Restrictions Weight Bearing Restrictions: No      Mobility Bed Mobility Overal bed mobility: Independent             General bed mobility comments: safe technique with upright posture  Transfers Overall transfer level: Independent Equipment used: None             General transfer comment: safe technique with upright posture    Balance Overall balance assessment: Independent(No LOB)                                         ADL either performed or assessed with clinical judgement   ADL Overall ADL's : Needs assistance/impaired Eating/Feeding: Set up;Independent   Grooming: Standing;Independent           Upper Body Dressing :  Set up;Independent   Lower Body Dressing: Set up;Independent   Toilet Transfer: Building services engineer and Hygiene: Independent               Vision Patient Visual Report: No change from baseline       Perception     Praxis      Pertinent Vitals/Pain Pain Assessment: Faces Pain Score: 3  Pain Location: discomfort at IV sight     Hand Dominance     Extremity/Trunk Assessment Upper Extremity Assessment Upper Extremity Assessment: Overall WFL for tasks assessed       Communication Communication Communication: Prefers language other than English;Interpreter utilized   Cognition Arousal/Alertness: Awake/alert Behavior During Therapy: WFL for tasks assessed/performed Overall Cognitive Status: Within Functional Limits for tasks assessed                                     General Comments       Exercises     Shoulder Instructions      Home Living Family/patient expects to be discharged to:: Private residence Living Arrangements: Children Available Help at Discharge: Available PRN/intermittently Type of Home: House Home Access: Stairs to enter [employer] of Steps: 1 Entrance Stairs-Rails: None Home Layout: One level  Home Equipment: None          Prior Functioning/Environment Level of Independence: Independent        Comments: reports no falls and is very active walking in home and likes to garden        OT Problem List:        OT Treatment/Interventions:      OT Goals(Current goals can be found in the care plan section) Acute Rehab OT Goals Patient Stated Goal: To return home OT Goal Formulation: With patient Potential to Achieve Goals: Good  OT Frequency:     Barriers to D/C:            Co-evaluation              AM-PAC OT "6 Clicks" Daily Activity     Outcome Measure Help from another person eating meals?: None Help from another person  taking care of personal grooming?: None Help from another person toileting, which includes using toliet, bedpan, or urinal?: None Help from another person bathing (including washing, rinsing, drying)?: None Help from another person to put on and taking off regular upper body clothing?: None Help from another person to put on and taking off regular lower body clothing?: None 6 Click Score: 24   End of Session    Activity Tolerance: Patient tolerated treatment well Patient left: in bed;with family/visitor present  OT Visit Diagnosis: Muscle weakness (generalized) (M62.81)                Time: 3748-2707 OT Time Calculation (min): 23 min Charges:  OT General Charges $OT Visit: 1 Visit OT Evaluation $OT Eval Moderate Complexity: 1 Mod  Olegario Messier, MS, OTR/L  Olegario Messier 01/14/2020, 1:08 PM

## 2020-04-30 IMAGING — US US CAROTID DUPLEX BILAT
1 series · 13 of 24 positions shown · non-contrast
Comparison: None.

CLINICAL DATA: 79-year-old female with a history dizziness

EXAM:
BILATERAL CAROTID DUPLEX ULTRASOUND
TECHNIQUE: Gray scale imaging, color Doppler and duplex ultrasound were
performed of bilateral carotid and vertebral arteries in the neck.

[Series 1: us carotid duplex bilat · 13 of 66 slices shown]
[im 1/66]
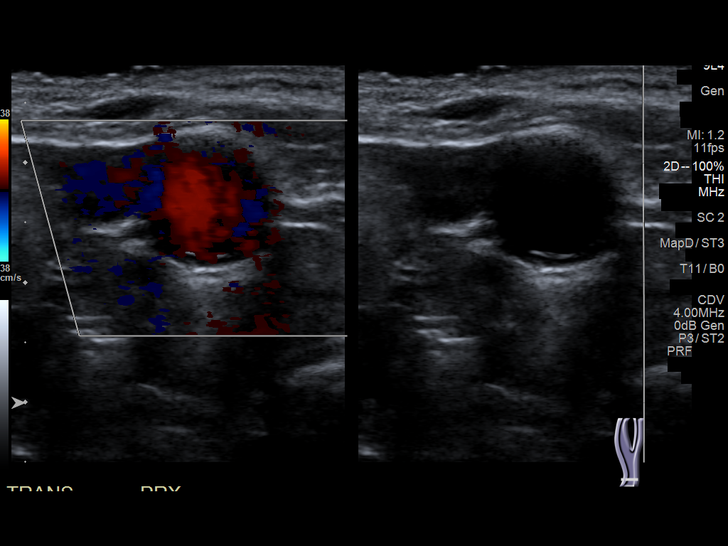
[im 6/66]
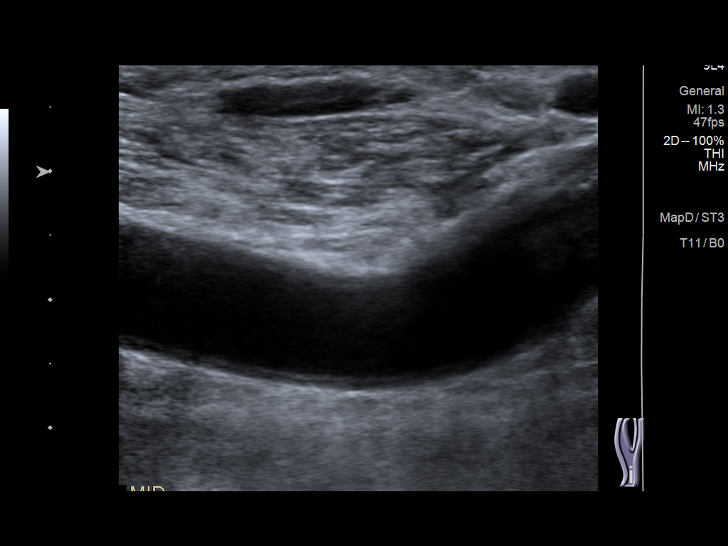
[im 12/66]
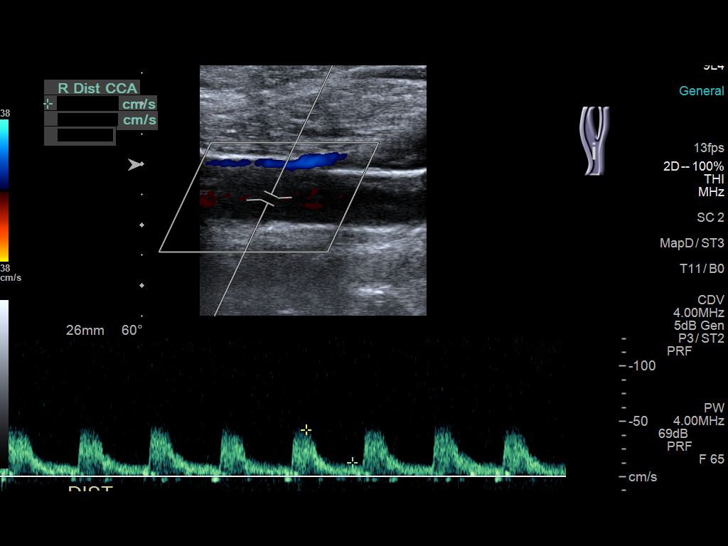
[im 17/66]
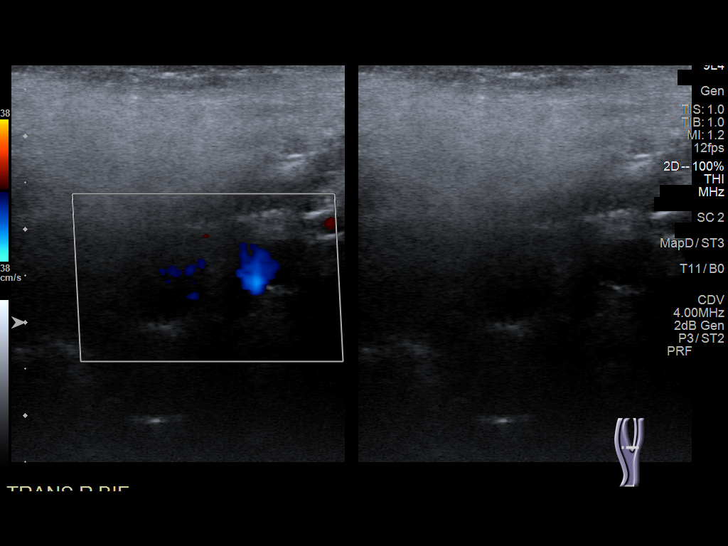
[im 23/66]
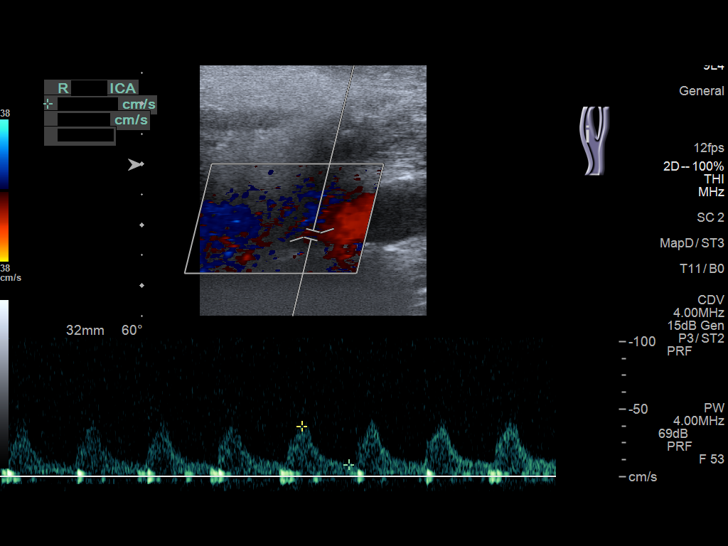
[im 29/66]
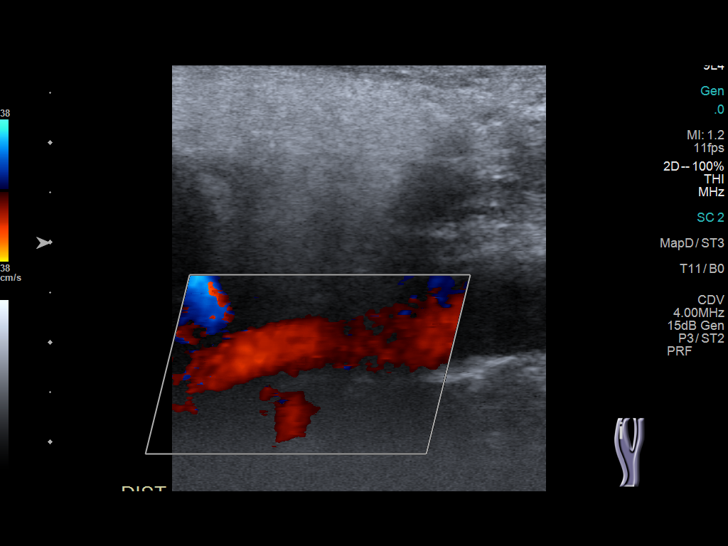
[im 34/66]
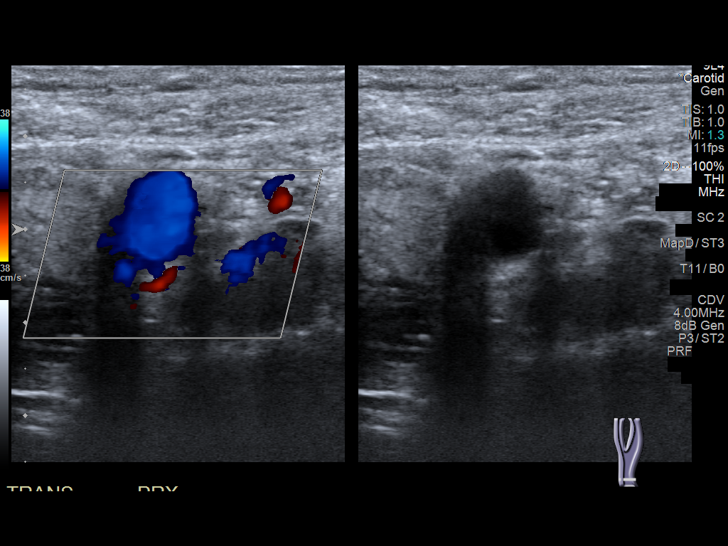
[im 37/66]
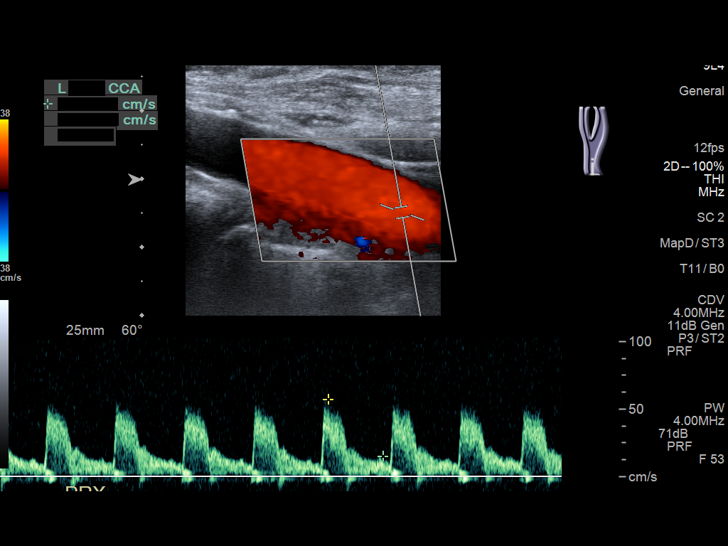
[im 43/66]
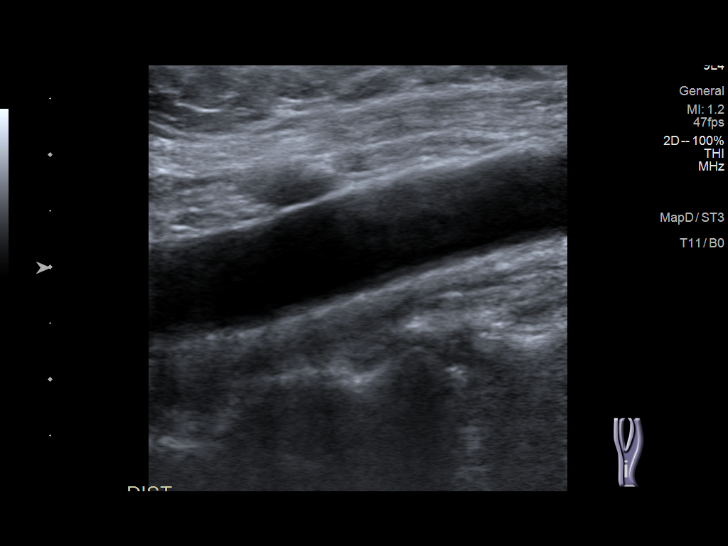
[im 49/66]
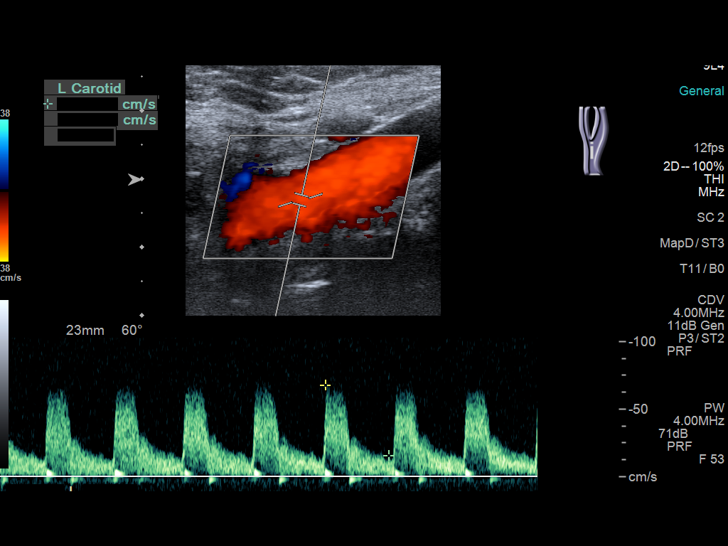
[im 54/66]
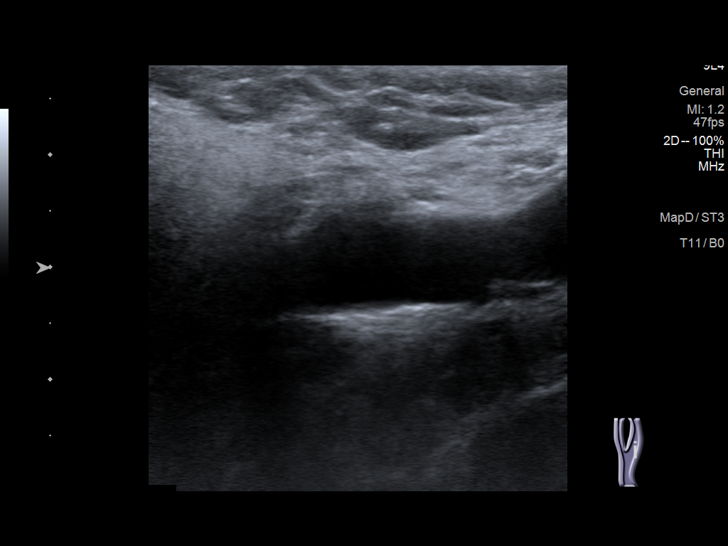
[im 60/66]
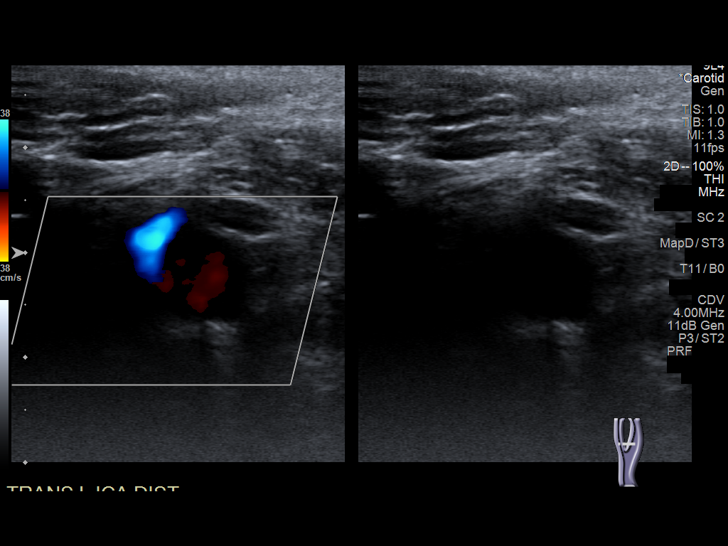
[im 66/66]
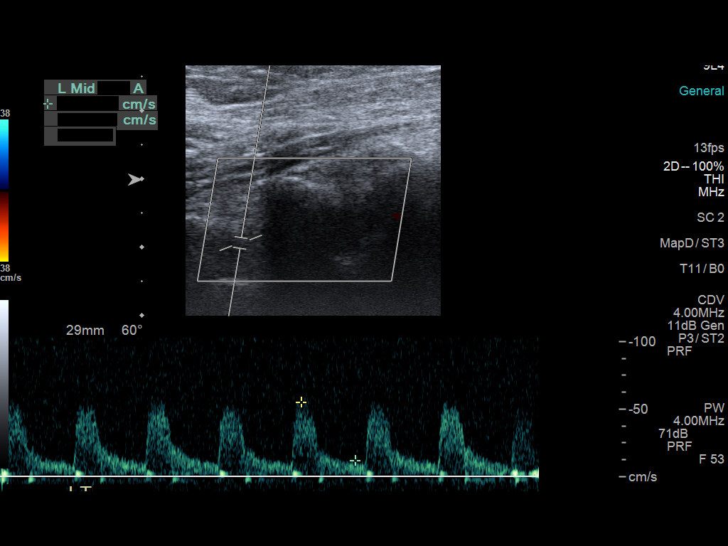

[13 of 24 positions shown; findings below may reference images not displayed]

FINDINGS: Criteria: Quantification of carotid stenosis is based on velocity
parameters that correlate the residual internal carotid diameter
with NASCET-based stenosis levels, using the diameter of the distal
internal carotid lumen as the denominator for stenosis measurement.

The following velocity measurements were obtained:

RIGHT

ICA:  Systolic 73 cm/sec, Diastolic 26 cm/sec

CCA:  59 cm/sec

SYSTOLIC ICA/CCA RATIO:

ECA:  48 cm/sec

LEFT

ICA:  Systolic 80 cm/sec, Diastolic 23 cm/sec

CCA:  59 cm/sec

SYSTOLIC ICA/CCA RATIO:

ECA:  68 cm/sec

Right Brachial SBP: Not acquired

Left Brachial SBP: Not acquired

RIGHT CAROTID ARTERY: No significant calcified disease of the right
common carotid artery. Intermediate waveform maintained.
Heterogeneous plaque without significant calcifications at the right
carotid bifurcation. Low resistance waveform of the right ICA. No
significant tortuosity.

RIGHT VERTEBRAL ARTERY: Antegrade flow with low resistance waveform.

LEFT CAROTID ARTERY: No significant calcified disease of the left
common carotid artery. Intermediate waveform maintained.
Heterogeneous plaque at the left carotid bifurcation without
significant calcifications. Low resistance waveform of the left ICA.

LEFT VERTEBRAL ARTERY:  Antegrade flow with low resistance waveform.
IMPRESSION: Color duplex indicates minimal heterogeneous plaque, with no
hemodynamically significant stenosis by duplex criteria in the
extracranial cerebrovascular circulation.
